# Patient Record
Sex: Male | Born: 2000 | Race: White | Hispanic: No | Marital: Single | Smoking: Never smoker
Health system: Southern US, Community
[De-identification: ages and names within clinical notes are randomized; demographics above are authoritative.]

## PROBLEM LIST (undated history)

## (undated) DIAGNOSIS — F909 Attention-deficit hyperactivity disorder, unspecified type: Secondary | ICD-10-CM

## (undated) DIAGNOSIS — F4310 Post-traumatic stress disorder, unspecified: Secondary | ICD-10-CM

## (undated) DIAGNOSIS — F840 Autistic disorder: Secondary | ICD-10-CM

## (undated) DIAGNOSIS — K5900 Constipation, unspecified: Secondary | ICD-10-CM

## (undated) DIAGNOSIS — F88 Other disorders of psychological development: Secondary | ICD-10-CM

## (undated) DIAGNOSIS — R011 Cardiac murmur, unspecified: Secondary | ICD-10-CM

## (undated) HISTORY — DX: Constipation, unspecified: K59.00

## (undated) HISTORY — PX: MYRINGOTOMY WITH TUBE PLACEMENT: SHX5663

## (undated) HISTORY — PX: URETHROMEATOPLASTY: SUR1420

---
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
## 2020-07-28 ENCOUNTER — Encounter: Payer: Self-pay | Admitting: Psychiatry

## 2020-11-17 ENCOUNTER — Other Ambulatory Visit: Payer: Self-pay

## 2020-11-17 ENCOUNTER — Encounter: Payer: Self-pay | Admitting: Adult Health

## 2020-11-17 ENCOUNTER — Ambulatory Visit (INDEPENDENT_AMBULATORY_CARE_PROVIDER_SITE_OTHER): Payer: 59 | Admitting: Adult Health

## 2020-11-17 DIAGNOSIS — F4312 Post-traumatic stress disorder, chronic: Secondary | ICD-10-CM

## 2020-11-17 DIAGNOSIS — F902 Attention-deficit hyperactivity disorder, combined type: Secondary | ICD-10-CM | POA: Diagnosis not present

## 2020-11-17 DIAGNOSIS — F88 Other disorders of psychological development: Secondary | ICD-10-CM | POA: Diagnosis not present

## 2020-11-17 DIAGNOSIS — F3342 Major depressive disorder, recurrent, in full remission: Secondary | ICD-10-CM

## 2020-11-17 MED ORDER — LISDEXAMFETAMINE DIMESYLATE 50 MG PO CAPS: 50.0000 mg | ORAL_CAPSULE | Freq: Every day | ORAL | 0 refills | Status: DC

## 2020-11-17 NOTE — Progress Notes (Signed)
Mark [name] [id number] 16-Apr-2001 20 y.o.  Subjective:   Patient ID:  Mark [name] is a 20 y.o. (DOB 12-Oct-2000) male.  Chief Complaint: No chief complaint on file.   HPI Nakota L Shells presents to the office today for follow-up of ADHD, remitted major depression, PTSD, and secondary neurodevelopmental disorder.  Describes mood today as "ok". Pleasant. Denies tearfulness. Mood symptoms - denies depression, anxiety, and irritability. Stating "I'm doing ok emotionally". Would like to restart ADHD medications  - previously taking Vyvanse 60mg  daily. Has not been on medications in over a year . Stating "I didn't think I needed them". Has taken ADHD medications since childhood. Stable interest and motivation. Taking medications as prescribed.  Energy levels lower. Active, does not have a regular exercise routine. Enjoys some usual interests and activities. Dating. Lives with mother.Spending time with family. Appetite adequate. Weight gain - 20 pounds - 280 to 290 pounds.. Sleeps well most nights. Averages 6 to 7 hours. Focus and concentration stable. Completing tasks. Managing aspects of household. Working with mother - . Work's at [employer]. Denies SI or HI.  Denies AH or VH.  Previous medication trials: Vyvanse 50mg    Review of Systems:  Review of Systems  Musculoskeletal: Negative for gait problem.  Neurological: Negative for tremors.  Psychiatric/Behavioral:       Please refer to HPI    Medications: I have reviewed the patient's current medications.  Current Outpatient Medications  Medication Sig Dispense Refill  . lisdexamfetamine (VYVANSE) 50 MG capsule Take 1 capsule (50 mg total) by mouth daily. 30 capsule 0  . buPROPion (WELLBUTRIN XL) 150 MG 24 hr tablet Take 1 tablet (150 mg total) by mouth daily after breakfast. 30 tablet 5  . hydrOXYzine (ATARAX/VISTARIL) 25 MG tablet Take 1 tablet (25 mg total) by mouth 3 (three) times daily as needed  for anxiety. 30 tablet 0  . lisdexamfetamine (VYVANSE) 50 MG capsule Take 1 capsule (50 mg total) by mouth daily after breakfast for 30 days. 30 capsule 0  . lisdexamfetamine (VYVANSE) 50 MG capsule Take 1 capsule (50 mg total) by mouth daily after breakfast for 30 days. 30 capsule 0  . lisdexamfetamine (VYVANSE) 50 MG capsule Take 1 capsule (50 mg total) by mouth daily after breakfast for 30 days. 30 capsule 0   No current facility-administered medications for this visit.    Medication Side Effects: None  Allergies: No Known Allergies  Past Medical History:  Diagnosis Date  . ADHD (attention deficit hyperactivity disorder)   . Autism spectrum disorder   . Constipation   . Heart murmur   . PTSD (post-traumatic stress disorder)   . Sensory integration disorder     Family History  Adopted: Yes  Problem Relation Age of Onset  . Cholelithiasis Mother   . Nephrolithiasis Mother   . Lactose intolerance Father   . Nephrolithiasis Maternal Uncle   . Ulcers Neg Hx   . Celiac disease Neg Hx     Social History   Socioeconomic History  . Marital status: Single    Spouse name: Not on file  . Number of children: Not on file  . Years of education: Not on file  . Highest education level: Not on file  Occupational History  . Not on file  Tobacco Use  . Smoking status: Never Smoker  . Smokeless tobacco: Never Used  Vaping Use  . Vaping Use: Some days  Substance and Sexual Activity  . Alcohol use: No  . Drug  use: No  . Sexual activity: Not on file  Other Topics Concern  . Not on file  Social History Narrative   6th grade 2014-2015   Social Determinants of Health   Financial Resource Strain: Not on file  Food Insecurity: Not on file  Transportation Needs: Not on file  Physical Activity: Not on file  Stress: Not on file  Social Connections: Not on file  Intimate Partner Violence: Not on file    Past Medical History, Surgical history, Social history, and Family history  were reviewed and updated as appropriate.   Please see review of systems for further details on the patient's review from today.   Objective:   Physical Exam:  There were no vitals taken for this visit.  Physical Exam Constitutional:      General: He is not in acute distress. Musculoskeletal:        General: No deformity.  Neurological:     Mental Status: He is alert and oriented to person, place, and time.     Coordination: Coordination normal.  Psychiatric:        Attention and Perception: Attention and perception normal. He does not perceive auditory or visual hallucinations.        Mood and Affect: Mood normal. Mood is not anxious or depressed. Affect is not labile, blunt, angry or inappropriate.        Speech: Speech normal.        Behavior: Behavior normal.        Thought Content: Thought content normal. Thought content is not paranoid or delusional. Thought content does not include homicidal or suicidal ideation. Thought content does not include homicidal or suicidal plan.        Cognition and Memory: Cognition and memory normal.        Judgment: Judgment normal.     Comments: Insight intact     Lab Review:     Component Value Date/Time   NA 138 09/05/2017 1237   K 4.1 09/05/2017 1237   CL 102 09/05/2017 1237   CO2 30 09/05/2017 1237   GLUCOSE 106 (H) 09/05/2017 1237   BUN 9 09/05/2017 1237   CREATININE 0.96 09/05/2017 1237   CALCIUM 9.7 09/05/2017 1237   PROT 7.4 09/05/2017 1237   ALBUMIN 4.8 09/05/2017 1237   AST 20 09/05/2017 1237   ALT 16 (L) 09/05/2017 1237   ALKPHOS 61 09/05/2017 1237   BILITOT 0.9 09/05/2017 1237   GFRNONAA NOT CALCULATED 09/05/2017 1237   GFRAA NOT CALCULATED 09/05/2017 1237       Component Value Date/Time   WBC 8.6 09/05/2017 1237   RBC 5.20 09/05/2017 1237   HGB 15.2 09/05/2017 1237   HCT 45.9 09/05/2017 1237   PLT 256 09/05/2017 1237   MCV 88.3 09/05/2017 1237   MCH 29.2 09/05/2017 1237   MCHC 33.1 09/05/2017 1237   RDW  13.3 09/05/2017 1237   LYMPHSABS 1.6 03/18/2014 1117   MONOABS 0.6 03/18/2014 1117   EOSABS 0.2 03/18/2014 1117   BASOSABS 0.0 03/18/2014 1117    No results found for: POCLITH, LITHIUM   No results found for: PHENYTOIN, PHENOBARB, VALPROATE, CBMZ   .res Assessment: Plan:    Plan:  PDMP reviewed  1. Add Vyvanse 50mg  daily  126/81 - 75  Read and reviewed note with patient for accuracy.   RTC 3 months  Patient advised to contact office with any questions, adverse effects, or acute worsening in signs and symptoms.  Discussed potential benefits, risks, and side  effects of stimulants with patient to include increased heart rate, palpitations, insomnia, increased anxiety, increased irritability, or decreased appetite.  Instructed patient to contact office if experiencing any significant tolerability issues.  Diagnoses and all orders for this visit:  Secondary neurodevelopmental disorder  Chronic post-traumatic stress disorder  Major depressive disorder, recurrent, in full remission with atypical features (HCC)  Attention deficit hyperactivity disorder (ADHD), combined type, moderate -     lisdexamfetamine (VYVANSE) 50 MG capsule; Take 1 capsule (50 mg total) by mouth daily.     Please see After Visit Summary for patient specific instructions.  Future Appointments  Date Time Provider Department Center  02/15/2021  2:40 PM Alla Sloma, Thereasa Solo, NP [facility] None    No orders of the defined types were placed in this encounter.   -------------------------------

## 2020-12-31 ENCOUNTER — Telehealth: Payer: Self-pay | Admitting: Adult Health

## 2020-12-31 NOTE — Telephone Encounter (Signed)
I sent in a refill for pick up on 12/29/2020.

## 2020-12-31 NOTE — Telephone Encounter (Signed)
Please review

## 2020-12-31 NOTE — Telephone Encounter (Signed)
Mark [name] called in needing refill for Vyvanse 50mg . Appt 5/9. Pharmacy [facility] [address] [place]

## 2021-02-15 ENCOUNTER — Ambulatory Visit: Payer: 59 | Admitting: Adult Health

## 2021-12-13 DIAGNOSIS — M5441 Lumbago with sciatica, right side: Secondary | ICD-10-CM | POA: Diagnosis not present

## 2021-12-13 DIAGNOSIS — M5442 Lumbago with sciatica, left side: Secondary | ICD-10-CM | POA: Diagnosis not present

## 2022-04-04 DIAGNOSIS — F902 Attention-deficit hyperactivity disorder, combined type: Secondary | ICD-10-CM | POA: Diagnosis not present

## 2022-04-04 DIAGNOSIS — F840 Autistic disorder: Secondary | ICD-10-CM | POA: Diagnosis not present

## 2022-04-04 DIAGNOSIS — F411 Generalized anxiety disorder: Secondary | ICD-10-CM | POA: Diagnosis not present

## 2022-04-04 DIAGNOSIS — F3342 Major depressive disorder, recurrent, in full remission: Secondary | ICD-10-CM | POA: Diagnosis not present

## 2022-05-04 DIAGNOSIS — F840 Autistic disorder: Secondary | ICD-10-CM | POA: Diagnosis not present

## 2022-05-04 DIAGNOSIS — F3342 Major depressive disorder, recurrent, in full remission: Secondary | ICD-10-CM | POA: Diagnosis not present

## 2022-05-04 DIAGNOSIS — F902 Attention-deficit hyperactivity disorder, combined type: Secondary | ICD-10-CM | POA: Diagnosis not present

## 2022-05-04 DIAGNOSIS — F411 Generalized anxiety disorder: Secondary | ICD-10-CM | POA: Diagnosis not present

## 2022-05-10 ENCOUNTER — Ambulatory Visit: Payer: BC Managed Care – PPO | Admitting: Adult Health

## 2022-05-10 ENCOUNTER — Encounter: Payer: Self-pay | Admitting: Adult Health

## 2022-05-10 DIAGNOSIS — F3342 Major depressive disorder, recurrent, in full remission: Secondary | ICD-10-CM | POA: Diagnosis not present

## 2022-05-10 DIAGNOSIS — F902 Attention-deficit hyperactivity disorder, combined type: Secondary | ICD-10-CM | POA: Diagnosis not present

## 2022-05-10 DIAGNOSIS — F88 Other disorders of psychological development: Secondary | ICD-10-CM | POA: Diagnosis not present

## 2022-05-10 DIAGNOSIS — F4312 Post-traumatic stress disorder, chronic: Secondary | ICD-10-CM | POA: Diagnosis not present

## 2022-05-10 MED ORDER — BUPROPION HCL ER (XL) 150 MG PO TB24: 150.0000 mg | ORAL_TABLET | Freq: Every day | ORAL | 5 refills | Status: DC

## 2022-05-10 MED ORDER — LISDEXAMFETAMINE DIMESYLATE 50 MG PO CAPS: 50.0000 mg | ORAL_CAPSULE | Freq: Every day | ORAL | 0 refills | Status: DC

## 2022-05-10 MED ORDER — HYDROXYZINE HCL 25 MG PO TABS: 25.0000 mg | ORAL_TABLET | Freq: Three times a day (TID) | ORAL | 5 refills | Status: DC | PRN

## 2022-05-10 NOTE — Progress Notes (Signed)
[name] [birth year] 21 y.o.  Subjective:   Patient ID:  Mark [name] is a 21 y.o. (DOB May 18, 2001) male.  Chief Complaint: No chief complaint on file.   HPI  Mark [name] presents to the office today for follow-up of ADHD, major depression, PTSD, and secondary neurodevelopmental disorder.  Accompanied by mother.  Describes mood today as "ok". Pleasant. Denies tearfulness. Mood symptoms - reports increased depression. Feels anxious - "follows me around throughout the day". Feels irritable at times - "boils up to a point". Getting better about removing himself from situations. Mood is lower. Stating "I'm not doing as well as I was". Has struggled over the past 6 to 7 months without medications and would like to restart them. Decreased interest and motivation.   Energy levels lower. Active, has a regular exercise routine - 3 to 4 times a week. Enjoys some usual interests and activities. Dating[place]. Spending time with family. Appetite adequate. Weight stable.  280 to 290 pounds.. Sleeps better some nights than others - difficulties getting to sleep. Averages 6 hours during the week and longer on the weekend. Focus and concentration difficulties. Completing tasks. Managing aspects of household. 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 Work's at [employer]. Denies SI or HI.  Denies AH or VH. Denies self harm. Denies substance use.  Previous medication trials: Vyvanse 50mg    Review of Systems:  Review of Systems  Musculoskeletal:  Negative for gait problem.  Neurological:  Negative for tremors.  Psychiatric/Behavioral:         Please refer to HPI    Medications: I have reviewed the patient's current medications.  Current Outpatient Medications  Medication Sig Dispense Refill   buPROPion (WELLBUTRIN XL) 150 MG 24 hr tablet Take 1 tablet (150 mg total) by mouth daily after breakfast. 30 tablet 5   hydrOXYzine (ATARAX) 25  MG tablet Take 1 tablet (25 mg total) by mouth 3 (three) times daily as needed for anxiety. 30 tablet 5   lisdexamfetamine (VYVANSE) 50 MG capsule Take 1 capsule (50 mg total) by mouth daily. 30 capsule 0   lisdexamfetamine (VYVANSE) 50 MG capsule Take 1 capsule (50 mg total) by mouth daily after breakfast. 30 capsule 0   [START ON 06/07/2022] lisdexamfetamine (VYVANSE) 50 MG capsule Take 1 capsule (50 mg total) by mouth daily after breakfast. 30 capsule 0   [START ON 07/05/2022] lisdexamfetamine (VYVANSE) 50 MG capsule Take 1 capsule (50 mg total) by mouth daily after breakfast. 30 capsule 0   No current facility-administered medications for this visit.    Medication Side Effects: None  Allergies: No Known Allergies  Past Medical History:  Diagnosis Date   ADHD (attention deficit hyperactivity disorder)    Autism spectrum disorder    Constipation    Heart murmur    PTSD (post-traumatic stress disorder)    Sensory integration disorder     Past Medical History, Surgical history, Social history, and Family history were reviewed and updated as appropriate.   Please see review of systems for further details on the patient's review from today.   Objective:   Physical Exam:  There were no vitals taken for this visit.  Physical Exam Constitutional:      General: He is not in acute distress. Musculoskeletal:        General: No deformity.  Neurological:     Mental Status: He is alert and oriented to person, place, and time.     Coordination:  Coordination normal.  Psychiatric:        Attention and Perception: Attention and perception normal. He does not perceive auditory or visual hallucinations.        Mood and Affect: Mood normal. Mood is not anxious or depressed. Affect is not labile, blunt, angry or inappropriate.        Speech: Speech normal.        Behavior: Behavior normal.        Thought Content: Thought content normal. Thought content is not paranoid or delusional. Thought  content does not include homicidal or suicidal ideation. Thought content does not include homicidal or suicidal plan.        Cognition and Memory: Cognition and memory normal.        Judgment: Judgment normal.     Comments: Insight intact     Lab Review:     Component Value Date/Time   NA 138 09/05/2017 1237   K 4.1 09/05/2017 1237   CL 102 09/05/2017 1237   CO2 30 09/05/2017 1237   GLUCOSE 106 (H) 09/05/2017 1237   BUN 9 09/05/2017 1237   CREATININE 0.96 09/05/2017 1237   CALCIUM 9.7 09/05/2017 1237   PROT 7.4 09/05/2017 1237   ALBUMIN 4.8 09/05/2017 1237   AST 20 09/05/2017 1237   ALT 16 (L) 09/05/2017 1237   ALKPHOS 61 09/05/2017 1237   BILITOT 0.9 09/05/2017 1237   GFRNONAA NOT CALCULATED 09/05/2017 1237   GFRAA NOT CALCULATED 09/05/2017 1237       Component Value Date/Time   WBC 8.6 09/05/2017 1237   RBC 5.20 09/05/2017 1237   HGB 15.2 09/05/2017 1237   HCT 45.9 09/05/2017 1237   PLT 256 09/05/2017 1237   MCV 88.3 09/05/2017 1237   MCH 29.2 09/05/2017 1237   MCHC 33.1 09/05/2017 1237   RDW 13.3 09/05/2017 1237   LYMPHSABS 1.6 03/18/2014 1117   MONOABS 0.6 03/18/2014 1117   EOSABS 0.2 03/18/2014 1117   BASOSABS 0.0 03/18/2014 1117    No results found for: "POCLITH", "LITHIUM"   No results found for: "PHENYTOIN", "PHENOBARB", "VALPROATE", "CBMZ"   .res Assessment: Plan:    Plan:  PDMP reviewed  Add Vyvanse 50mg  daily Add Wellbutrin XL 150mg  daily Add Hydroxyzine 25mg  TID prn anxiety  117/61/64  RTC 4 weeks  Patient advised to contact office with any questions, adverse effects, or acute worsening in signs and symptoms.  Discussed potential benefits, risks, and side effects of stimulants with patient to include increased heart rate, palpitations, insomnia, increased anxiety, increased irritability, or decreased appetite.  Instructed patient to contact office if experiencing any significant tolerability issues.  Diagnoses and all orders for this  visit:  Secondary neurodevelopmental disorder  Attention deficit hyperactivity disorder (ADHD), combined type, moderate -     lisdexamfetamine (VYVANSE) 50 MG capsule; Take 1 capsule (50 mg total) by mouth daily after breakfast. -     lisdexamfetamine (VYVANSE) 50 MG capsule; Take 1 capsule (50 mg total) by mouth daily after breakfast. -     lisdexamfetamine (VYVANSE) 50 MG capsule; Take 1 capsule (50 mg total) by mouth daily after breakfast. -     buPROPion (WELLBUTRIN XL) 150 MG 24 hr tablet; Take 1 tablet (150 mg total) by mouth daily after breakfast.  Major depressive disorder, recurrent, in full remission with atypical features (HCC) -     buPROPion (WELLBUTRIN XL) 150 MG 24 hr tablet; Take 1 tablet (150 mg total) by mouth daily after breakfast.  Chronic post-traumatic stress disorder -  buPROPion (WELLBUTRIN XL) 150 MG 24 hr tablet; Take 1 tablet (150 mg total) by mouth daily after breakfast. -     hydrOXYzine (ATARAX) 25 MG tablet; Take 1 tablet (25 mg total) by mouth 3 (three) times daily as needed for anxiety.     Please see After Visit Summary for patient specific instructions.  Future Appointments  Date Time Provider Department Center  06/07/2022  3:40 PM Rowin Bayron, Thereasa Solo, NP [facility] None     No orders of the defined types were placed in this encounter.   -------------------------------

## 2022-05-18 DIAGNOSIS — F840 Autistic disorder: Secondary | ICD-10-CM | POA: Diagnosis not present

## 2022-05-18 DIAGNOSIS — F3342 Major depressive disorder, recurrent, in full remission: Secondary | ICD-10-CM | POA: Diagnosis not present

## 2022-05-18 DIAGNOSIS — F411 Generalized anxiety disorder: Secondary | ICD-10-CM | POA: Diagnosis not present

## 2022-05-18 DIAGNOSIS — F902 Attention-deficit hyperactivity disorder, combined type: Secondary | ICD-10-CM | POA: Diagnosis not present

## 2022-06-01 DIAGNOSIS — F411 Generalized anxiety disorder: Secondary | ICD-10-CM | POA: Diagnosis not present

## 2022-06-01 DIAGNOSIS — F902 Attention-deficit hyperactivity disorder, combined type: Secondary | ICD-10-CM | POA: Diagnosis not present

## 2022-06-01 DIAGNOSIS — F3342 Major depressive disorder, recurrent, in full remission: Secondary | ICD-10-CM | POA: Diagnosis not present

## 2022-06-01 DIAGNOSIS — F840 Autistic disorder: Secondary | ICD-10-CM | POA: Diagnosis not present

## 2022-06-07 ENCOUNTER — Ambulatory Visit (INDEPENDENT_AMBULATORY_CARE_PROVIDER_SITE_OTHER): Payer: Self-pay | Admitting: Adult Health

## 2022-06-07 DIAGNOSIS — F489 Nonpsychotic mental disorder, unspecified: Secondary | ICD-10-CM

## 2022-06-07 NOTE — Progress Notes (Signed)
Patient no show appointment. ? ?

## 2022-06-14 ENCOUNTER — Encounter: Payer: Self-pay | Admitting: Adult Health

## 2022-06-14 ENCOUNTER — Ambulatory Visit (INDEPENDENT_AMBULATORY_CARE_PROVIDER_SITE_OTHER): Payer: BC Managed Care – PPO | Admitting: Adult Health

## 2022-06-14 DIAGNOSIS — F4312 Post-traumatic stress disorder, chronic: Secondary | ICD-10-CM

## 2022-06-14 DIAGNOSIS — F3342 Major depressive disorder, recurrent, in full remission: Secondary | ICD-10-CM | POA: Diagnosis not present

## 2022-06-14 DIAGNOSIS — F902 Attention-deficit hyperactivity disorder, combined type: Secondary | ICD-10-CM

## 2022-06-14 DIAGNOSIS — F88 Other disorders of psychological development: Secondary | ICD-10-CM | POA: Diagnosis not present

## 2022-06-14 NOTE — Progress Notes (Signed)
Mark [name] [id number] 17-Jul-2001 20 y.o.  Subjective:   Patient ID:  Mark [name] is a 21 y.o. (DOB 05/01/2001) male.  Chief Complaint: No chief complaint on file.   HPI Mark [name] presents to the office today for follow-up of ADHD, major depression, PTSD, and secondary neurodevelopmental disorder.  Describes mood today as "ok". Pleasant. Denies tearfulness. Mood symptoms - reports decreased depression - "a little here and there - nothing I can't move away from". Feels anxious - "here and there". Feels irritable at times - "was initially, but it passed". Mood has improved - "more consistent with day to day things - getting done what I need to get done. Stating "I'm doing pretty good". Feels like medications are working well. Decreased interest and motivation.   Energy levels improved. Active, has a regular exercise routine. Enjoys some usual interests and activities. Dating. [place] Spending time with family. Appetite adequate. Weight loss - a few pounds - 290 pounds.. Sleeps better some nights than others. Averages 6 hours during the week and more on the weekend. Focus and concentration difficulties. Completing tasks. Managing aspects of household. 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 Work's at [employer]. Denies SI or HI.  Denies AH or VH. Denies self harm. Denies substance use.  Previous medication trials: Vyvanse 50mg       Review of Systems:  Review of Systems  Musculoskeletal:  Negative for gait problem.  Neurological:  Negative for tremors.  Psychiatric/Behavioral:         Please refer to HPI    Medications: I have reviewed the patient's current medications.  Current Outpatient Medications  Medication Sig Dispense Refill   buPROPion (WELLBUTRIN XL) 150 MG 24 hr tablet Take 1 tablet (150 mg total) by mouth daily after breakfast. 30 tablet 5   hydrOXYzine (ATARAX) 25 MG tablet Take 1 tablet (25 mg total) by mouth 3  (three) times daily as needed for anxiety. 30 tablet 5   lisdexamfetamine (VYVANSE) 50 MG capsule Take 1 capsule (50 mg total) by mouth daily. 30 capsule 0   lisdexamfetamine (VYVANSE) 50 MG capsule Take 1 capsule (50 mg total) by mouth daily after breakfast. 30 capsule 0   lisdexamfetamine (VYVANSE) 50 MG capsule Take 1 capsule (50 mg total) by mouth daily after breakfast. 30 capsule 0   [START ON 07/05/2022] lisdexamfetamine (VYVANSE) 50 MG capsule Take 1 capsule (50 mg total) by mouth daily after breakfast. 30 capsule 0   No current facility-administered medications for this visit.    Medication Side Effects: None  Allergies: No Known Allergies  Past Medical History:  Diagnosis Date   ADHD (attention deficit hyperactivity disorder)    Autism spectrum disorder    Constipation    Heart murmur    PTSD (post-traumatic stress disorder)    Sensory integration disorder     Past Medical History, Surgical history, Social history, and Family history were reviewed and updated as appropriate.   Please see review of systems for further details on the patient's review from today.   Objective:   Physical Exam:  There were no vitals taken for this visit.  Physical Exam Constitutional:      General: He is not in acute distress. Musculoskeletal:        General: No deformity.  Neurological:     Mental Status: He is alert and oriented to person, place, and time.     Coordination: Coordination normal.  Psychiatric:  Attention and Perception: Attention and perception normal. He does not perceive auditory or visual hallucinations.        Mood and Affect: Mood normal. Mood is not anxious or depressed. Affect is not labile, blunt, angry or inappropriate.        Speech: Speech normal.        Behavior: Behavior normal.        Thought Content: Thought content normal. Thought content is not paranoid or delusional. Thought content does not include homicidal or suicidal ideation. Thought  content does not include homicidal or suicidal plan.        Cognition and Memory: Cognition and memory normal.        Judgment: Judgment normal.     Comments: Insight intact     Lab Review:     Component Value Date/Time   NA 138 09/05/2017 1237   K 4.1 09/05/2017 1237   CL 102 09/05/2017 1237   CO2 30 09/05/2017 1237   GLUCOSE 106 (H) 09/05/2017 1237   BUN 9 09/05/2017 1237   CREATININE 0.96 09/05/2017 1237   CALCIUM 9.7 09/05/2017 1237   PROT 7.4 09/05/2017 1237   ALBUMIN 4.8 09/05/2017 1237   AST 20 09/05/2017 1237   ALT 16 (L) 09/05/2017 1237   ALKPHOS 61 09/05/2017 1237   BILITOT 0.9 09/05/2017 1237   GFRNONAA NOT CALCULATED 09/05/2017 1237   GFRAA NOT CALCULATED 09/05/2017 1237       Component Value Date/Time   WBC 8.6 09/05/2017 1237   RBC 5.20 09/05/2017 1237   HGB 15.2 09/05/2017 1237   HCT 45.9 09/05/2017 1237   PLT 256 09/05/2017 1237   MCV 88.3 09/05/2017 1237   MCH 29.2 09/05/2017 1237   MCHC 33.1 09/05/2017 1237   RDW 13.3 09/05/2017 1237   LYMPHSABS 1.6 03/18/2014 1117   MONOABS 0.6 03/18/2014 1117   EOSABS 0.2 03/18/2014 1117   BASOSABS 0.0 03/18/2014 1117    No results found for: "POCLITH", "LITHIUM"   No results found for: "PHENYTOIN", "PHENOBARB", "VALPROATE", "CBMZ"   .res Assessment: Plan:    Plan:  PDMP reviewed  Vyvanse 50mg  daily Wellbutrin XL 150mg  daily Hydroxyzine 25mg  TID prn anxiety  117/61/64  RTC 4 weeks  Patient advised to contact office with any questions, adverse effects, or acute worsening in signs and symptoms.  Discussed potential benefits, risks, and side effects of stimulants with patient to include increased heart rate, palpitations, insomnia, increased anxiety, increased irritability, or decreased appetite.  Instructed patient to contact office if experiencing any significant tolerability issues. Diagnoses and all orders for this visit:  Attention deficit hyperactivity disorder (ADHD), combined type,  moderate  Major depressive disorder, recurrent, in full remission with atypical features [facility])  Secondary neurodevelopmental disorder  Chronic post-traumatic stress disorder     Please see After Visit Summary for patient specific instructions.  Future Appointments  Date Time Provider Department Center  09/13/2022 12:00 PM Jamesia Linnen, , NP [facility] None    No orders of the defined types were placed in this encounter.   -------------------------------

## 2022-06-21 DIAGNOSIS — F840 Autistic disorder: Secondary | ICD-10-CM | POA: Diagnosis not present

## 2022-06-21 DIAGNOSIS — F902 Attention-deficit hyperactivity disorder, combined type: Secondary | ICD-10-CM | POA: Diagnosis not present

## 2022-06-21 DIAGNOSIS — F3342 Major depressive disorder, recurrent, in full remission: Secondary | ICD-10-CM | POA: Diagnosis not present

## 2022-06-21 DIAGNOSIS — F411 Generalized anxiety disorder: Secondary | ICD-10-CM | POA: Diagnosis not present

## 2022-06-28 DIAGNOSIS — F3342 Major depressive disorder, recurrent, in full remission: Secondary | ICD-10-CM | POA: Diagnosis not present

## 2022-06-28 DIAGNOSIS — F411 Generalized anxiety disorder: Secondary | ICD-10-CM | POA: Diagnosis not present

## 2022-06-28 DIAGNOSIS — F902 Attention-deficit hyperactivity disorder, combined type: Secondary | ICD-10-CM | POA: Diagnosis not present

## 2022-06-28 DIAGNOSIS — F840 Autistic disorder: Secondary | ICD-10-CM | POA: Diagnosis not present

## 2022-07-12 DIAGNOSIS — F3342 Major depressive disorder, recurrent, in full remission: Secondary | ICD-10-CM | POA: Diagnosis not present

## 2022-07-12 DIAGNOSIS — F840 Autistic disorder: Secondary | ICD-10-CM | POA: Diagnosis not present

## 2022-07-12 DIAGNOSIS — F902 Attention-deficit hyperactivity disorder, combined type: Secondary | ICD-10-CM | POA: Diagnosis not present

## 2022-07-12 DIAGNOSIS — F411 Generalized anxiety disorder: Secondary | ICD-10-CM | POA: Diagnosis not present

## 2022-08-23 DIAGNOSIS — F902 Attention-deficit hyperactivity disorder, combined type: Secondary | ICD-10-CM | POA: Diagnosis not present

## 2022-08-23 DIAGNOSIS — F840 Autistic disorder: Secondary | ICD-10-CM | POA: Diagnosis not present

## 2022-08-23 DIAGNOSIS — F3342 Major depressive disorder, recurrent, in full remission: Secondary | ICD-10-CM | POA: Diagnosis not present

## 2022-08-23 DIAGNOSIS — F411 Generalized anxiety disorder: Secondary | ICD-10-CM | POA: Diagnosis not present

## 2022-09-06 DIAGNOSIS — M9902 Segmental and somatic dysfunction of thoracic region: Secondary | ICD-10-CM | POA: Diagnosis not present

## 2022-09-06 DIAGNOSIS — M25552 Pain in left hip: Secondary | ICD-10-CM | POA: Diagnosis not present

## 2022-09-06 DIAGNOSIS — M5384 Other specified dorsopathies, thoracic region: Secondary | ICD-10-CM | POA: Diagnosis not present

## 2022-09-06 DIAGNOSIS — M9905 Segmental and somatic dysfunction of pelvic region: Secondary | ICD-10-CM | POA: Diagnosis not present

## 2022-09-13 ENCOUNTER — Encounter: Payer: Self-pay | Admitting: Adult Health

## 2022-09-13 ENCOUNTER — Ambulatory Visit (INDEPENDENT_AMBULATORY_CARE_PROVIDER_SITE_OTHER): Payer: BC Managed Care – PPO | Admitting: Adult Health

## 2022-09-13 DIAGNOSIS — F88 Other disorders of psychological development: Secondary | ICD-10-CM | POA: Diagnosis not present

## 2022-09-13 DIAGNOSIS — F4312 Post-traumatic stress disorder, chronic: Secondary | ICD-10-CM | POA: Diagnosis not present

## 2022-09-13 DIAGNOSIS — F902 Attention-deficit hyperactivity disorder, combined type: Secondary | ICD-10-CM | POA: Diagnosis not present

## 2022-09-13 DIAGNOSIS — F3342 Major depressive disorder, recurrent, in full remission: Secondary | ICD-10-CM | POA: Diagnosis not present

## 2022-09-13 MED ORDER — LISDEXAMFETAMINE DIMESYLATE 50 MG PO CAPS: 50.0000 mg | ORAL_CAPSULE | Freq: Every day | ORAL | 0 refills | Status: AC

## 2022-09-13 MED ORDER — HYDROXYZINE HCL 25 MG PO TABS: 25.0000 mg | ORAL_TABLET | Freq: Three times a day (TID) | ORAL | 5 refills | Status: AC | PRN

## 2022-09-13 MED ORDER — BUPROPION HCL ER (XL) 150 MG PO TB24: 150.0000 mg | ORAL_TABLET | Freq: Every day | ORAL | 5 refills | Status: AC

## 2022-09-13 NOTE — Progress Notes (Signed)
Mark [name] [id number] [birth year] 21 y.o.  Subjective:   Patient ID:  Mark [name] is a 21 y.o. (DOB [birth year]) male.  Chief Complaint: No chief complaint on file.   HPI Latravis L Radney presents to the office today for follow-up of ADHD, major depression, PTSD, and secondary neurodevelopmental disorder.  Describes mood today as "ok". Pleasant. Denies tearfulness. Mood symptoms - denies depression and anxiety. Reports some irritability - "dissipates easily". Reports some worry, rumination, and over thinking - "here and there". Mood is consistent. Stating "I feel like I'm doing ok". Improved interest and motivation. Feels like medications are working well.  Energy levels improved. Active, exercising some. Enjoys some usual interests and activities. Single. Lives alone. Spending time with family. Appetite adequate. Weight stable - 290 to 300 pounds.. Sleeps better some nights than others. Averages 7.5 hours during the week and longer on the weekend. Focus and concentration improved with Vyvanse. Completing tasks. Managing aspects of household. 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 Work's at [employer]. Denies SI or HI.  Denies AH or VH. Denies self harm. Denies substance use.  Previous medication trials: Vyvanse 50mg    Review of Systems:  Review of Systems  Musculoskeletal:  Negative for gait problem.  Neurological:  Negative for tremors.  Psychiatric/Behavioral:         Please refer to HPI    Medications: I have reviewed the patient's current medications.  Current Outpatient Medications  Medication Sig Dispense Refill   buPROPion (WELLBUTRIN XL) 150 MG 24 hr tablet Take 1 tablet (150 mg total) by mouth daily after breakfast. 30 tablet 5   hydrOXYzine (ATARAX) 25 MG tablet Take 1 tablet (25 mg total) by mouth 3 (three) times daily as needed for anxiety. 30 tablet 5   lisdexamfetamine (VYVANSE) 50 MG capsule Take 1 capsule (50 mg total) by mouth daily. 30  capsule 0   lisdexamfetamine (VYVANSE) 50 MG capsule Take 1 capsule (50 mg total) by mouth daily after breakfast. 30 capsule 0   lisdexamfetamine (VYVANSE) 50 MG capsule Take 1 capsule (50 mg total) by mouth daily after breakfast. 30 capsule 0   lisdexamfetamine (VYVANSE) 50 MG capsule Take 1 capsule (50 mg total) by mouth daily after breakfast. 30 capsule 0   No current facility-administered medications for this visit.    Medication Side Effects: None  Allergies: No Known Allergies  Past Medical History:  Diagnosis Date   ADHD (attention deficit hyperactivity disorder)    Autism spectrum disorder    Constipation    Heart murmur    PTSD (post-traumatic stress disorder)    Sensory integration disorder     Past Medical History, Surgical history, Social history, and Family history were reviewed and updated as appropriate.   Please see review of systems for further details on the patient's review from today.   Objective:   Physical Exam:  There were no vitals taken for this visit.  Physical Exam Constitutional:      General: He is not in acute distress. Musculoskeletal:        General: No deformity.  Neurological:     Mental Status: He is alert and oriented to person, place, and time.     Coordination: Coordination normal.  Psychiatric:        Attention and Perception: Attention and perception normal. He does not perceive auditory or visual hallucinations.        Mood and Affect: Mood normal. Mood is not anxious or depressed. Affect is not labile, blunt,  angry or inappropriate.        Speech: Speech normal.        Behavior: Behavior normal.        Thought Content: Thought content normal. Thought content is not paranoid or delusional. Thought content does not include homicidal or suicidal ideation. Thought content does not include homicidal or suicidal plan.        Cognition and Memory: Cognition and memory normal.        Judgment: Judgment normal.     Comments: Insight  intact     Lab Review:     Component Value Date/Time   NA 138 09/05/2017 1237   K 4.1 09/05/2017 1237   CL 102 09/05/2017 1237   CO2 30 09/05/2017 1237   GLUCOSE 106 (H) 09/05/2017 1237   BUN 9 09/05/2017 1237   CREATININE 0.96 09/05/2017 1237   CALCIUM 9.7 09/05/2017 1237   PROT 7.4 09/05/2017 1237   ALBUMIN 4.8 09/05/2017 1237   AST 20 09/05/2017 1237   ALT 16 (L) 09/05/2017 1237   ALKPHOS 61 09/05/2017 1237   BILITOT 0.9 09/05/2017 1237   GFRNONAA NOT CALCULATED 09/05/2017 1237   GFRAA NOT CALCULATED 09/05/2017 1237       Component Value Date/Time   WBC 8.6 09/05/2017 1237   RBC 5.20 09/05/2017 1237   HGB 15.2 09/05/2017 1237   HCT 45.9 09/05/2017 1237   PLT 256 09/05/2017 1237   MCV 88.3 09/05/2017 1237   MCH 29.2 09/05/2017 1237   MCHC 33.1 09/05/2017 1237   RDW 13.3 09/05/2017 1237   LYMPHSABS 1.6 03/18/2014 1117   MONOABS 0.6 03/18/2014 1117   EOSABS 0.2 03/18/2014 1117   BASOSABS 0.0 03/18/2014 1117    No results found for: "POCLITH", "LITHIUM"   No results found for: "PHENYTOIN", "PHENOBARB", "VALPROATE", "CBMZ"   .res Assessment: Plan:    Plan:  PDMP reviewed  Vyvanse 50mg  daily Wellbutrin XL 150mg  daily Hydroxyzine 25mg  TID prn anxiety  Monitor BP between visits while taking stimulant medication.   103/64/65  RTC 3 months  Patient advised to contact office with any questions, adverse effects, or acute worsening in signs and symptoms.  Discussed potential benefits, risks, and side effects of stimulants with patient to include increased heart rate, palpitations, insomnia, increased anxiety, increased irritability, or decreased appetite.  Instructed patient to contact office if experiencing any significant tolerability issues. There are no diagnoses linked to this encounter.   Please see After Visit Summary for patient specific instructions.  No future appointments.  No orders of the defined types were placed in this  encounter.   -------------------------------

## 2022-09-14 ENCOUNTER — Other Ambulatory Visit: Payer: Self-pay

## 2022-09-14 ENCOUNTER — Telehealth: Payer: Self-pay | Admitting: Psychiatry

## 2022-09-14 NOTE — Telephone Encounter (Signed)
Message made in error and sent to wrong provider

## 2022-09-14 NOTE — Telephone Encounter (Signed)
Last visit is 09/13/22. Mark [name] called and said that his symptoms are a lot worse since [place] increased his [place]. Could someone please call him at [phone].

## 2022-09-20 DIAGNOSIS — M5442 Lumbago with sciatica, left side: Secondary | ICD-10-CM | POA: Diagnosis not present

## 2022-09-23 ENCOUNTER — Other Ambulatory Visit: Payer: Self-pay

## 2022-09-23 ENCOUNTER — Telehealth: Payer: Self-pay | Admitting: Adult Health

## 2022-09-23 DIAGNOSIS — F902 Attention-deficit hyperactivity disorder, combined type: Secondary | ICD-10-CM

## 2022-09-23 MED ORDER — LISDEXAMFETAMINE DIMESYLATE 50 MG PO CAPS: 50.0000 mg | ORAL_CAPSULE | Freq: Every day | ORAL | 0 refills | Status: AC

## 2022-09-23 NOTE — Telephone Encounter (Signed)
Not available there..please send to [facility] in [place] instead

## 2022-09-23 NOTE — Telephone Encounter (Signed)
PT's mom called at 2:57p.  She would like his Vyvanse 50mg  sent to [facility] on [address].  NO upcoming appts scheduled.

## 2022-09-23 NOTE — Telephone Encounter (Signed)
Pended.

## 2022-10-09 ENCOUNTER — Encounter (HOSPITAL_COMMUNITY): Payer: Self-pay

## 2022-10-09 ENCOUNTER — Ambulatory Visit (HOSPITAL_COMMUNITY)
Admission: EM | Admit: 2022-10-09 | Discharge: 2022-10-09 | Disposition: A | Discharge status: Home / Self Care | Payer: BC Managed Care – PPO | Attending: Family Medicine | Admitting: Family Medicine

## 2022-10-09 DIAGNOSIS — Z1152 Encounter for screening for COVID-19: Secondary | ICD-10-CM | POA: Diagnosis not present

## 2022-10-09 DIAGNOSIS — F840 Autistic disorder: Secondary | ICD-10-CM | POA: Diagnosis not present

## 2022-10-09 DIAGNOSIS — F902 Attention-deficit hyperactivity disorder, combined type: Secondary | ICD-10-CM | POA: Diagnosis not present

## 2022-10-09 DIAGNOSIS — J029 Acute pharyngitis, unspecified: Secondary | ICD-10-CM | POA: Diagnosis not present

## 2022-10-09 DIAGNOSIS — F4310 Post-traumatic stress disorder, unspecified: Secondary | ICD-10-CM | POA: Insufficient documentation

## 2022-10-09 DIAGNOSIS — J069 Acute upper respiratory infection, unspecified: Secondary | ICD-10-CM | POA: Diagnosis not present

## 2022-10-09 LAB — POCT RAPID STREP A, ED / UC: Streptococcus, Group A Screen (Direct): NEGATIVE

## 2022-10-09 MED ORDER — IBUPROFEN 800 MG PO TABS: 800.0000 mg | ORAL_TABLET | Freq: Three times a day (TID) | ORAL | 0 refills | Status: AC | PRN

## 2022-10-09 NOTE — ED Provider Notes (Signed)
[facility]    CSN: [id number] Arrival date & time: 10/09/22  1136      History   Chief Complaint Chief Complaint  Patient presents with   Sore Throat   Fatigue   Fever    HPI Mark [name] is a 21 y.o. male.    Sore Throat  Fever  Here for sore throat and postnasal congestion that began on December 28.  He has had some subjective fever but no chills.  No cough.  No vomiting or diarrhea.  Past Medical History:  Diagnosis Date   ADHD (attention deficit hyperactivity disorder)    Autism spectrum disorder    Constipation    Heart murmur    PTSD (post-traumatic stress disorder)    Sensory integration disorder     Patient Active Problem List   Diagnosis Date Noted   Secondary neurodevelopmental disorder 10/31/2018   Major depressive disorder, recurrent, in full remission with atypical features (HCC) 10/07/2018   Chronic post-traumatic stress disorder 10/07/2018   Attention deficit hyperactivity disorder (ADHD), combined type, moderate 09/06/2017   Suicide ideation 09/06/2017   Periumbilical abdominal pain 03/18/2014   Unspecified constipation     Past Surgical History:  Procedure Laterality Date   MYRINGOTOMY WITH TUBE PLACEMENT     URETHROMEATOPLASTY         Home Medications    Prior to Admission medications   Medication Sig Start Date End Date Taking? Authorizing Provider  ibuprofen (ADVIL) 800 MG tablet Take 1 tablet (800 mg total) by mouth every 8 (eight) hours as needed (pain). 10/09/22  Yes Zenia Resides, MD  buPROPion (WELLBUTRIN XL) 150 MG 24 hr tablet Take 1 tablet (150 mg total) by mouth daily after breakfast. 09/13/22   Mozingo, Thereasa Solo, NP  hydrOXYzine (ATARAX) 25 MG tablet Take 1 tablet (25 mg total) by mouth 3 (three) times daily as needed for anxiety. 09/13/22   Mozingo, Thereasa Solo, NP  lisdexamfetamine (VYVANSE) 50 MG capsule Take 1 capsule (50 mg total) by mouth daily after breakfast. 09/13/22 10/13/22  Mozingo,  Thereasa Solo, NP  lisdexamfetamine (VYVANSE) 50 MG capsule Take 1 capsule (50 mg total) by mouth daily after breakfast. 10/11/22 11/10/22  Mozingo, Thereasa Solo, NP  lisdexamfetamine (VYVANSE) 50 MG capsule Take 1 capsule (50 mg total) by mouth daily after breakfast. 11/08/22 12/08/22  Mozingo, Thereasa Solo, NP  lisdexamfetamine (VYVANSE) 50 MG capsule Take 1 capsule (50 mg total) by mouth daily. 09/23/22   Cottle, Steva Ready., MD    Family History Family History  Adopted: Yes  Problem Relation Age of Onset   Cholelithiasis Mother    Nephrolithiasis Mother    Lactose intolerance Father    Nephrolithiasis Maternal Uncle    Ulcers Neg Hx    Celiac disease Neg Hx     Social History Social History   Tobacco Use   Smoking status: Never   Smokeless tobacco: Never  Vaping Use   Vaping Use: Some days  Substance Use Topics   Alcohol use: No   Drug use: No     Allergies   Patient has no known allergies.   Review of Systems Review of Systems  Constitutional:  Positive for fever.     Physical Exam Triage Vital Signs ED Triage Vitals [10/09/22 1232]  Enc Vitals Group     BP (!) 146/86     Pulse Rate (!) 106     Resp 16     Temp 98.4 F (36.9 C)  Temp Source Oral     SpO2 100 %     Weight      Height      Head Circumference      Peak Flow      Pain Score      Pain Loc      Pain Edu?      Excl. in GC?    No data found.  Updated Vital Signs BP (!) 146/86 (BP Location: Left Arm)   Pulse (!) 106   Temp 98.4 F (36.9 C) (Oral)   Resp 16   SpO2 100%   Visual Acuity Right Eye Distance:   Left Eye Distance:   Bilateral Distance:    Right Eye Near:   Left Eye Near:    Bilateral Near:     Physical Exam Vitals reviewed.  Constitutional:      General: He is not in acute distress.    Appearance: He is not ill-appearing, toxic-appearing or diaphoretic.  HENT:     Right Ear: Tympanic membrane and ear canal normal.     Left Ear: Tympanic membrane and  ear canal normal.     Nose: Nose normal.     Mouth/Throat:     Mouth: Mucous membranes are moist.     Comments: Oropharynx and tonsillar pillars are very erythematous. Eyes:     Extraocular Movements: Extraocular movements intact.     Conjunctiva/sclera: Conjunctivae normal.     Pupils: Pupils are equal, round, and reactive to light.  Cardiovascular:     Rate and Rhythm: Normal rate and regular rhythm.     Heart sounds: No murmur heard. Pulmonary:     Effort: No respiratory distress.     Breath sounds: No stridor. No wheezing, rhonchi or rales.  Musculoskeletal:     Cervical back: Neck supple.  Lymphadenopathy:     Cervical: No cervical adenopathy.  Skin:    Capillary Refill: Capillary refill takes less than 2 seconds.     Coloration: Skin is not jaundiced or pale.  Neurological:     General: No focal deficit present.     Mental Status: He is alert and oriented to person, place, and time.  Psychiatric:        Behavior: Behavior normal.      UC Treatments / Results  Labs (all labs ordered are listed, but only abnormal results are displayed) Labs Reviewed  CULTURE, GROUP A STREP ([facility])  SARS CORONAVIRUS 2 (TAT 6-24 HRS)  POCT RAPID STREP A, ED / UC    EKG   Radiology No results found.  Procedures Procedures (including critical care time)  Medications Ordered in UC Medications - No data to display  Initial Impression / Assessment and Plan / UC Course  I have reviewed the triage vital signs and the nursing notes.  Pertinent labs & imaging results that were available during my care of the patient were reviewed by me and considered in my medical decision making (see chart for details).        Rapid strep is negative, so we will send culture and treat per protocol if positive.  COVID swab is done and if positive, he is at risk for severe disease.  He is a candidate for Paxlovid.  Last creatinine done was normal. Final Clinical Impressions(s) / UC Diagnoses    Final diagnoses:  Sore throat  Viral upper respiratory tract infection     Discharge Instructions      Your strep test is negative.  Culture of  the throat will be sent, and staff will notify you if that is in turn positive.   You have been swabbed for COVID, and the test will result in the next 24 hours. Our staff will call you if positive. If the COVID test is positive, you should quarantine for 5 days from the start of your symptoms  Take ibuprofen 800 mg--1 tab every 8 hours as needed for pain.       ED Prescriptions     Medication Sig Dispense Auth. Provider   ibuprofen (ADVIL) 800 MG tablet Take 1 tablet (800 mg total) by mouth every 8 (eight) hours as needed (pain). 21 tablet Amador Braddy, Janace Aris, MD      PDMP not reviewed this encounter.   Zenia Resides, MD 10/09/22 [phone]

## 2022-10-09 NOTE — Discharge Instructions (Signed)
Your strep test is negative.  Culture of the throat will be sent, and staff will notify you if that is in turn positive.   You have been swabbed for COVID, and the test will result in the next 24 hours. Our staff will call you if positive. If the COVID test is positive, you should quarantine for 5 days from the start of your symptoms  Take ibuprofen 800 mg--1 tab every 8 hours as needed for pain.

## 2022-10-09 NOTE — ED Triage Notes (Signed)
3-days of sore throat, fever and fatigue. Pt has not taken any medication to help with symptoms.

## 2022-10-10 LAB — SARS CORONAVIRUS 2 (TAT 6-24 HRS): SARS Coronavirus 2: NEGATIVE

## 2022-10-12 DIAGNOSIS — H6692 Otitis media, unspecified, left ear: Secondary | ICD-10-CM | POA: Diagnosis not present

## 2022-10-12 LAB — CULTURE, GROUP A STREP (THRC)

## 2022-10-17 DIAGNOSIS — M545 Low back pain, unspecified: Secondary | ICD-10-CM | POA: Diagnosis not present

## 2022-10-17 DIAGNOSIS — T1590XA Foreign body on external eye, part unspecified, unspecified eye, initial encounter: Secondary | ICD-10-CM | POA: Diagnosis not present

## 2022-10-18 DIAGNOSIS — F840 Autistic disorder: Secondary | ICD-10-CM | POA: Diagnosis not present

## 2022-10-18 DIAGNOSIS — F411 Generalized anxiety disorder: Secondary | ICD-10-CM | POA: Diagnosis not present

## 2022-10-18 DIAGNOSIS — F3342 Major depressive disorder, recurrent, in full remission: Secondary | ICD-10-CM | POA: Diagnosis not present

## 2022-10-18 DIAGNOSIS — F902 Attention-deficit hyperactivity disorder, combined type: Secondary | ICD-10-CM | POA: Diagnosis not present

## 2022-11-01 DIAGNOSIS — F902 Attention-deficit hyperactivity disorder, combined type: Secondary | ICD-10-CM | POA: Diagnosis not present

## 2022-11-01 DIAGNOSIS — F840 Autistic disorder: Secondary | ICD-10-CM | POA: Diagnosis not present

## 2022-11-01 DIAGNOSIS — F3342 Major depressive disorder, recurrent, in full remission: Secondary | ICD-10-CM | POA: Diagnosis not present

## 2022-11-01 DIAGNOSIS — F411 Generalized anxiety disorder: Secondary | ICD-10-CM | POA: Diagnosis not present

## 2022-12-20 DIAGNOSIS — F411 Generalized anxiety disorder: Secondary | ICD-10-CM | POA: Diagnosis not present

## 2022-12-20 DIAGNOSIS — F3342 Major depressive disorder, recurrent, in full remission: Secondary | ICD-10-CM | POA: Diagnosis not present

## 2022-12-20 DIAGNOSIS — F902 Attention-deficit hyperactivity disorder, combined type: Secondary | ICD-10-CM | POA: Diagnosis not present

## 2022-12-20 DIAGNOSIS — F840 Autistic disorder: Secondary | ICD-10-CM | POA: Diagnosis not present

## 2023-01-18 DIAGNOSIS — F411 Generalized anxiety disorder: Secondary | ICD-10-CM | POA: Diagnosis not present

## 2023-01-18 DIAGNOSIS — F840 Autistic disorder: Secondary | ICD-10-CM | POA: Diagnosis not present

## 2023-01-18 DIAGNOSIS — F902 Attention-deficit hyperactivity disorder, combined type: Secondary | ICD-10-CM | POA: Diagnosis not present

## 2023-01-18 DIAGNOSIS — F3342 Major depressive disorder, recurrent, in full remission: Secondary | ICD-10-CM | POA: Diagnosis not present

## 2023-02-01 DIAGNOSIS — F840 Autistic disorder: Secondary | ICD-10-CM | POA: Diagnosis not present

## 2023-02-01 DIAGNOSIS — F3342 Major depressive disorder, recurrent, in full remission: Secondary | ICD-10-CM | POA: Diagnosis not present

## 2023-02-01 DIAGNOSIS — F411 Generalized anxiety disorder: Secondary | ICD-10-CM | POA: Diagnosis not present

## 2023-02-01 DIAGNOSIS — F902 Attention-deficit hyperactivity disorder, combined type: Secondary | ICD-10-CM | POA: Diagnosis not present

## 2023-03-07 DIAGNOSIS — F840 Autistic disorder: Secondary | ICD-10-CM | POA: Diagnosis not present

## 2023-03-07 DIAGNOSIS — F3342 Major depressive disorder, recurrent, in full remission: Secondary | ICD-10-CM | POA: Diagnosis not present

## 2023-03-07 DIAGNOSIS — F902 Attention-deficit hyperactivity disorder, combined type: Secondary | ICD-10-CM | POA: Diagnosis not present

## 2023-03-07 DIAGNOSIS — F411 Generalized anxiety disorder: Secondary | ICD-10-CM | POA: Diagnosis not present

## 2023-04-19 DIAGNOSIS — F411 Generalized anxiety disorder: Secondary | ICD-10-CM | POA: Diagnosis not present

## 2023-04-19 DIAGNOSIS — F902 Attention-deficit hyperactivity disorder, combined type: Secondary | ICD-10-CM | POA: Diagnosis not present

## 2023-04-19 DIAGNOSIS — F3342 Major depressive disorder, recurrent, in full remission: Secondary | ICD-10-CM | POA: Diagnosis not present

## 2023-04-19 DIAGNOSIS — F840 Autistic disorder: Secondary | ICD-10-CM | POA: Diagnosis not present

## 2023-05-24 DIAGNOSIS — F902 Attention-deficit hyperactivity disorder, combined type: Secondary | ICD-10-CM | POA: Diagnosis not present

## 2023-05-24 DIAGNOSIS — F3342 Major depressive disorder, recurrent, in full remission: Secondary | ICD-10-CM | POA: Diagnosis not present

## 2023-05-24 DIAGNOSIS — F840 Autistic disorder: Secondary | ICD-10-CM | POA: Diagnosis not present

## 2023-05-24 DIAGNOSIS — F411 Generalized anxiety disorder: Secondary | ICD-10-CM | POA: Diagnosis not present

## 2024-02-09 DIAGNOSIS — Z6841 Body Mass Index (BMI) 40.0 and over, adult: Secondary | ICD-10-CM | POA: Diagnosis not present

## 2024-02-09 DIAGNOSIS — J029 Acute pharyngitis, unspecified: Secondary | ICD-10-CM | POA: Diagnosis not present
